# Patient Record
Sex: Female | Born: 1940 | Race: White | Hispanic: No | Marital: Married | Smoking: Never smoker
Health system: Southern US, Community
[De-identification: ages and names within clinical notes are randomized; demographics above are authoritative.]

## PROBLEM LIST (undated history)

## (undated) DIAGNOSIS — K219 Gastro-esophageal reflux disease without esophagitis: Secondary | ICD-10-CM

## (undated) DIAGNOSIS — S42309A Unspecified fracture of shaft of humerus, unspecified arm, initial encounter for closed fracture: Secondary | ICD-10-CM

## (undated) DIAGNOSIS — R42 Dizziness and giddiness: Secondary | ICD-10-CM

## (undated) HISTORY — PX: ABDOMINAL HYSTERECTOMY: SHX81

## (undated) HISTORY — DX: Unspecified fracture of shaft of humerus, unspecified arm, initial encounter for closed fracture: S42.309A

## (undated) HISTORY — DX: Gastro-esophageal reflux disease without esophagitis: K21.9

---

## 1993-02-01 HISTORY — PX: CARDIAC CATHETERIZATION: SHX172

## 1997-09-10 ENCOUNTER — Other Ambulatory Visit: Admission: RE | Admit: 1997-09-10 | Discharge: 1997-09-10 | Payer: Self-pay | Admitting: *Deleted

## 1997-11-13 ENCOUNTER — Other Ambulatory Visit: Admission: RE | Admit: 1997-11-13 | Discharge: 1997-11-13 | Payer: Self-pay | Admitting: *Deleted

## 1998-11-20 ENCOUNTER — Other Ambulatory Visit: Admission: RE | Admit: 1998-11-20 | Discharge: 1998-11-20 | Payer: Self-pay | Admitting: *Deleted

## 1999-07-27 ENCOUNTER — Inpatient Hospital Stay (HOSPITAL_COMMUNITY): Admission: RE | Admit: 1999-07-27 | Discharge: 1999-07-29 | Payer: Self-pay | Admitting: *Deleted

## 2001-07-19 ENCOUNTER — Other Ambulatory Visit: Admission: RE | Admit: 2001-07-19 | Discharge: 2001-07-19 | Payer: Self-pay | Admitting: *Deleted

## 2002-01-11 ENCOUNTER — Encounter: Admission: RE | Admit: 2002-01-11 | Discharge: 2002-01-11 | Payer: Self-pay

## 2002-10-23 ENCOUNTER — Other Ambulatory Visit: Admission: RE | Admit: 2002-10-23 | Discharge: 2002-10-23 | Payer: Self-pay | Admitting: *Deleted

## 2011-03-27 ENCOUNTER — Ambulatory Visit: Payer: Self-pay | Admitting: Internal Medicine

## 2012-02-21 ENCOUNTER — Encounter (HOSPITAL_COMMUNITY): Payer: Self-pay | Admitting: Family Medicine

## 2012-02-21 ENCOUNTER — Emergency Department (HOSPITAL_COMMUNITY)
Admission: EM | Admit: 2012-02-21 | Discharge: 2012-02-21 | Disposition: A | Discharge status: Home / Self Care | Payer: Medicare Other | Attending: Emergency Medicine | Admitting: Emergency Medicine

## 2012-02-21 ENCOUNTER — Emergency Department (HOSPITAL_COMMUNITY): Payer: Medicare Other

## 2012-02-21 DIAGNOSIS — R42 Dizziness and giddiness: Secondary | ICD-10-CM

## 2012-02-21 DIAGNOSIS — R110 Nausea: Secondary | ICD-10-CM | POA: Insufficient documentation

## 2012-02-21 DIAGNOSIS — M549 Dorsalgia, unspecified: Secondary | ICD-10-CM

## 2012-02-21 HISTORY — DX: Dizziness and giddiness: R42

## 2012-02-21 LAB — COMPREHENSIVE METABOLIC PANEL WITH GFR
ALT: 22 U/L (ref 0–35)
BUN: 20 mg/dL (ref 6–23)
CO2: 25 meq/L (ref 19–32)
Calcium: 9.7 mg/dL (ref 8.4–10.5)
Creatinine, Ser: 0.52 mg/dL (ref 0.50–1.10)
GFR calc Af Amer: >90 mL/min (ref >90)
GFR calc non Af Amer: >90 mL/min (ref >90)
Glucose, Bld: 103 mg/dL — ABNORMAL HIGH (ref 70–99)

## 2012-02-21 LAB — CBC WITH DIFFERENTIAL/PLATELET
Basophils Absolute: 0 10*3/uL (ref 0.0–0.1)
Basophils Relative: 0 % (ref 0–1)
Eosinophils Absolute: 0.3 10*3/uL (ref 0.0–0.7)
Eosinophils Relative: 3 % (ref 0–5)
HCT: 39.2 % (ref 36.0–46.0)
Hemoglobin: 13.3 g/dL (ref 12.0–15.0)
Lymphocytes Relative: 14 % (ref 12–46)
Lymphs Abs: 1.1 10*3/uL (ref 0.7–4.0)
MCH: 30.7 pg (ref 26.0–34.0)
MCHC: 33.9 g/dL (ref 30.0–36.0)
MCV: 90.5 fL (ref 78.0–100.0)
Monocytes Absolute: 0.8 10*3/uL (ref 0.1–1.0)
Monocytes Relative: 10 % (ref 3–12)
Neutro Abs: 5.9 10*3/uL (ref 1.7–7.7)
Neutrophils Relative %: 72 % (ref 43–77)
Platelets: 354 10*3/uL (ref 150–400)
RBC: 4.33 MIL/uL (ref 3.87–5.11)
RDW: 13.1 % (ref 11.5–15.5)
WBC: 8.2 10*3/uL (ref 4.0–10.5)

## 2012-02-21 LAB — LIPASE, BLOOD: Lipase: 45 U/L (ref 11–59)

## 2012-02-21 LAB — COMPREHENSIVE METABOLIC PANEL
AST: 30 U/L (ref 0–37)
Albumin: 3.9 g/dL (ref 3.5–5.2)
Alkaline Phosphatase: 71 U/L (ref 39–117)
Chloride: 102 mEq/L (ref 96–112)
Potassium: 4.8 mEq/L (ref 3.5–5.1)
Sodium: 138 mEq/L (ref 135–145)
Total Bilirubin: 0.2 mg/dL — ABNORMAL LOW (ref 0.3–1.2)
Total Protein: 7.6 g/dL (ref 6.0–8.3)

## 2012-02-21 LAB — POCT I-STAT TROPONIN I: Troponin i, poc: 0 ng/mL (ref 0.00–0.08)

## 2012-02-21 MED ORDER — MECLIZINE HCL 12.5 MG PO TABS: 12.5000 mg | ORAL_TABLET | Freq: Three times a day (TID) | ORAL | Status: AC | PRN

## 2012-02-21 NOTE — ED Notes (Signed)
Pt would not allow me to check bp in r leg states it hurt too much

## 2012-02-21 NOTE — ED Provider Notes (Signed)
History     CSN: [id number]  Arrival date & time 02/21/12  1659   First MD Initiated Contact with Patient 02/21/12 1822      Chief Complaint  Patient presents with  . Dizziness    (Consider location/radiation/quality/duration/timing/severity/associated sxs/prior treatment) HPI Comments: Patient reports that she was in her usual state of health except for she developed some gradual onset left upper back pain at around 11:30 this morning. She reports that she did her usual morning exercises and reports the pain actually got a little better during exercise. She denied any radiation, numbness or weakness, chest pain or shortness of breath. After eating at a restaurant with her spouse, shortly afterwards while in the car she became profoundly dizzy described more as a moving sensation. 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 She reports symptoms did seem to get worse with movement of the head. She had associated nausea but no vomiting. She denied significant headache, blurred vision, lightheadedness, syncope, chest pain. She denied slurred speech, facial droop, focal numbness or weakness. Her husband called her primary care physician who is able to see her at approximately 3 PM. Do to her continued symptoms and associated back pain, it was recommended that she be reevaluated in the emergency department. She has no history of coronary disease, MI, stroke. She reports as she was coming closer to the hospital here her symptoms are markedly improved. The upper left back pain is still present but not as severe as it had been. Her dizziness, or vertiginous symptoms, nausea or ulcers all. She is able to stand and walk around without any further reproduction of her symptoms.  The history is provided by the patient and the spouse.    Past Medical History  Diagnosis Date  . Vertigo     Past Surgical History    Procedure Date  . Abdominal hysterectomy     History reviewed. No pertinent family history.  History  Substance Use Topics  . Smoking status: Never Smoker   . Smokeless tobacco: Not on file  . Alcohol Use: No    OB History    Grav Para Term Preterm Abortions TAB SAB Ect Mult Living                  Review of Systems  Constitutional: Negative for fever, chills and diaphoresis.  Eyes: Negative for visual disturbance.  Respiratory: Negative for chest tightness and shortness of breath.   Cardiovascular: Negative for chest pain.  Gastrointestinal: Positive for nausea.  Musculoskeletal: Positive for back pain.  Neurological: Positive for dizziness. Negative for syncope, speech difficulty, weakness, light-headedness and numbness.  All other systems reviewed and are negative.    Allergies  Codeine  Home Medications   Current Outpatient Rx  Name  Route  Sig  Dispense  Refill  . ASPIRIN 81 MG PO TABS   Oral   Take 81 mg by mouth daily. For pain.         Marland Kitchen MECLIZINE HCL 12.5 MG PO TABS   Oral   Take 1 tablet (12.5 mg total) by mouth 3 (three) times daily as needed for dizziness (vertigo).   20 tablet   0     BP 150/75  Pulse 58  Temp 98 F (36.7 C)  Resp 16  SpO2 100%  Physical Exam  Nursing note and vitals reviewed. Constitutional: She is oriented to person, place,  and time. She appears well-developed and well-nourished.  HENT:  Head: Normocephalic and atraumatic.  Eyes: Pupils are equal, round, and reactive to light.  Neck: Normal range of motion. Neck supple.  Cardiovascular: Normal rate and regular rhythm.   No murmur heard. Pulmonary/Chest: Effort normal. No respiratory distress. She has no wheezes.  Abdominal: Soft. She exhibits no distension. There is no tenderness.  Musculoskeletal: She exhibits no tenderness.  Neurological: She is alert and oriented to person, place, and time. No cranial nerve deficit or sensory deficit. She exhibits normal  muscle tone. Coordination and gait normal. GCS eye subscore is 4. GCS verbal subscore is 5. GCS motor subscore is 6.       Normal finger to nose, no arm drift  Skin: Skin is warm.  Psychiatric: She has a normal mood and affect.    ED Course  Procedures (including critical care time)  Labs Reviewed  COMPREHENSIVE METABOLIC PANEL - Abnormal; Notable for the following:    Glucose, Bld 103 (*)     Total Bilirubin 0.2 (*)     All other components within normal limits  CBC WITH DIFFERENTIAL  LIPASE, BLOOD  POCT I-STAT TROPONIN I  URINALYSIS, ROUTINE W REFLEX MICROSCOPIC   Dg Chest 2 View  02/21/2012  *RADIOLOGY REPORT*  Clinical Data: Near-syncope.  Back pain.  CHEST - 2 VIEW  Comparison: None.  Findings: There is cardiomegaly without edema.  Lungs clear.  No pneumothorax or pleural effusion.  Thoracolumbar scoliosis noted.  IMPRESSION: Cardiomegaly without acute disease.   Original Report Authenticated By: Holley Dexter, M.D.      1. Vertigo   2. Back pain     Room air saturation is 100% I interpret this to be normal.  ECG obtained at time 17:08, shows normal sinus rhythm at a rate of 65, borderline low voltage QRS, nonspecific T wave abnormalities, normal axis. No prior EKGs are available. This is a borderline ECG by my interpretation.    7:08 PM No difference is noted between UE blood pressures.  MDM   Patient with symptoms that seem most consistent with acute vertigo and symptoms seemed mostly resolved at this time. She did have some upper left back pain that she reports improved with physical activity. This makes coronary disease less likely. Her EKG shows no active ischemia and her initial 20 care troponin is normal. Her symptoms began at 11:30, with a negative troponin, I am confident that MIs excluded. Chest x-ray shows no widened mediastinum. Also obtain a 4 lamp blood pressure reading to assess for discrepancy between her right and left upper extremities. If this is  negative, plan is to discharge the patient home with a prescription for Antivert.        Gavin Pound. Oletta Lamas, MD 02/21/12 4098

## 2012-02-21 NOTE — Discharge Instructions (Signed)
 Back Pain, Adult Low back pain is very common. About 1 in 5 people have back pain.The cause of low back pain is rarely dangerous. The pain often gets better over time.About half of people with a sudden onset of back pain feel better in just 2 weeks. About 8 in 10 people feel better by 6 weeks.  CAUSES Some common causes of back pain include:  Strain of the muscles or ligaments supporting the spine.  Wear and tear (degeneration) of the spinal discs.  Arthritis.  Direct injury to the back. DIAGNOSIS Most of the time, the direct cause of low back pain is not known.However, back pain can be treated effectively even when the exact cause of the pain is unknown.Answering your caregiver's questions about your overall health and symptoms is one of the most accurate ways to make sure the cause of your pain is not dangerous. If your caregiver needs more information, he or she may order lab work or imaging tests (X-rays or MRIs).However, even if imaging tests show changes in your back, this usually does not require surgery. HOME CARE INSTRUCTIONS For many people, back pain returns.Since low back pain is rarely dangerous, it is often a condition that people can learn to [facility] their own.   Remain active. It is stressful on the back to sit or stand in one place. Do not sit, drive, or stand in one place for more than 30 minutes at a time. Take short walks on level surfaces as soon as pain allows.Try to increase the length of time you walk each day.  Do not stay in bed.Resting more than 1 or 2 days can delay your recovery.  Do not avoid exercise or work.Your body is made to move.It is not dangerous to be active, even though your back may hurt.Your back will likely heal faster if you return to being active before your pain is gone.  Pay attention to your body when you bend and lift. Many people have less discomfortwhen lifting if they bend their knees, keep the load close to their bodies,and  avoid twisting. Often, the most comfortable positions are those that put less stress on your recovering back.  Find a comfortable position to sleep. Use a firm mattress and lie on your side with your knees slightly bent. If you lie on your back, put a pillow under your knees.  Only take over-the-counter or prescription medicines as directed by your caregiver. Over-the-counter medicines to reduce pain and inflammation are often the most helpful.Your caregiver may prescribe muscle relaxant drugs.These medicines help dull your pain so you can more quickly return to your normal activities and healthy exercise.  Put ice on the injured area.  Put ice in a plastic bag.  Place a towel between your skin and the bag.  Leave the ice on for 15 to 20 minutes, 3 to 4 times a day for the first 2 to 3 days. After that, ice and heat may be alternated to reduce pain and spasms.  Ask your caregiver about trying back exercises and gentle massage. This may be of some benefit.  Avoid feeling anxious or stressed.Stress increases muscle tension and can worsen back pain.It is important to recognize when you are anxious or stressed and learn ways to manage it.Exercise is a great option. SEEK MEDICAL CARE IF:  You have pain that is not relieved with rest or medicine.  You have pain that does not improve in 1 week.  You have new symptoms.  You are generally  not feeling well. SEEK IMMEDIATE MEDICAL CARE IF:   You have pain that radiates from your back into your legs.  You develop new bowel or bladder control problems.  You have unusual weakness or numbness in your arms or legs.  You develop nausea or vomiting.  You develop abdominal pain.  You feel faint. Document Released: 01/18/2005 Document Revised: 07/20/2011 Document Reviewed: 06/08/2010 [facility] Patient Information 2013 [place], [place].     Vertigo Vertigo means you feel like you or your surroundings are moving when they are not. Vertigo  can be dangerous if it occurs when you are at work, driving, or performing difficult activities.  CAUSES  Vertigo occurs when there is a conflict of signals sent to your brain from the visual and sensory systems in your body. There are many different causes of vertigo, including:  Infections, especially in the inner ear.  A bad reaction to a drug or misuse of alcohol and medicines.  Withdrawal from drugs or alcohol.  Rapidly changing positions, such as lying down or rolling over in bed.  A migraine headache.  Decreased blood flow to the brain.  Increased pressure in the brain from a head injury, infection, tumor, or bleeding. SYMPTOMS  You may feel as though the world is spinning around or you are falling to the ground. Because your balance is upset, vertigo can cause nausea and vomiting. You may have involuntary eye movements (nystagmus). DIAGNOSIS  Vertigo is usually diagnosed by physical exam. If the cause of your vertigo is unknown, your caregiver may perform imaging tests, such as an MRI scan (magnetic resonance imaging). TREATMENT  Most cases of vertigo resolve on their own, without treatment. Depending on the cause, your caregiver may prescribe certain medicines. If your vertigo is related to body position issues, your caregiver may recommend movements or procedures to correct the problem. In rare cases, if your vertigo is caused by certain inner ear problems, you may need surgery. HOME CARE INSTRUCTIONS   Follow your caregiver's instructions.  Avoid driving.  Avoid operating heavy machinery.  Avoid performing any tasks that would be dangerous to you or others during a vertigo episode.  Tell your caregiver if you notice that certain medicines seem to be causing your vertigo. Some of the medicines used to treat vertigo episodes can actually make them worse in some people. SEEK IMMEDIATE MEDICAL CARE IF:   Your medicines do not relieve your vertigo or are making it  worse.  You develop problems with talking, walking, weakness, or using your arms, hands, or legs.  You develop severe headaches.  Your nausea or vomiting continues or gets worse.  You develop visual changes.  A family member notices behavioral changes.  Your condition gets worse. MAKE SURE YOU:  Understand these instructions.  Will watch your condition.  Will get help right away if you are not doing well or get worse. Document Released: 10/28/2004 Document Revised: 04/12/2011 Document Reviewed: 08/06/2010 [facility] Patient Information 2013 [place], [place].

## 2012-02-21 NOTE — ED Notes (Signed)
Per pt sts extreme dizziness, nausea, that started about 1:30. sts after eating. Denies chest pain and SOB. sts pain ain upper back.

## 2013-08-21 ENCOUNTER — Other Ambulatory Visit: Payer: Self-pay | Admitting: Family Medicine

## 2013-08-21 DIAGNOSIS — Z1231 Encounter for screening mammogram for malignant neoplasm of breast: Secondary | ICD-10-CM

## 2013-08-23 ENCOUNTER — Other Ambulatory Visit: Payer: Self-pay | Admitting: Family Medicine

## 2013-08-23 ENCOUNTER — Ambulatory Visit
Admission: RE | Admit: 2013-08-23 | Discharge: 2013-08-23 | Disposition: A | Discharge status: Home / Self Care | Payer: 59 | Source: Ambulatory Visit | Attending: Family Medicine | Admitting: Family Medicine

## 2013-08-23 DIAGNOSIS — Z139 Encounter for screening, unspecified: Secondary | ICD-10-CM

## 2013-10-01 NOTE — Telephone Encounter (Signed)
This encounter was created in error - please disregard.

## 2013-10-19 ENCOUNTER — Emergency Department: Payer: Self-pay | Admitting: Emergency Medicine

## 2013-10-31 ENCOUNTER — Encounter: Payer: Self-pay | Admitting: Cardiovascular Disease

## 2013-10-31 ENCOUNTER — Encounter (INDEPENDENT_AMBULATORY_CARE_PROVIDER_SITE_OTHER): Payer: Self-pay

## 2013-10-31 ENCOUNTER — Ambulatory Visit (INDEPENDENT_AMBULATORY_CARE_PROVIDER_SITE_OTHER): Payer: Medicare Other | Admitting: Cardiovascular Disease

## 2013-10-31 VITALS — BP 158/90 | HR 56 | Ht 67.0 in | Wt 126.2 lb

## 2013-10-31 DIAGNOSIS — R42 Dizziness and giddiness: Secondary | ICD-10-CM

## 2013-10-31 DIAGNOSIS — E785 Hyperlipidemia, unspecified: Secondary | ICD-10-CM

## 2013-10-31 DIAGNOSIS — S42309S Unspecified fracture of shaft of humerus, unspecified arm, sequela: Secondary | ICD-10-CM

## 2013-10-31 DIAGNOSIS — R9431 Abnormal electrocardiogram [ECG] [EKG]: Secondary | ICD-10-CM

## 2013-10-31 DIAGNOSIS — S42302S Unspecified fracture of shaft of humerus, left arm, sequela: Secondary | ICD-10-CM

## 2013-10-31 DIAGNOSIS — I517 Cardiomegaly: Secondary | ICD-10-CM

## 2013-10-31 DIAGNOSIS — R0602 Shortness of breath: Secondary | ICD-10-CM

## 2013-10-31 DIAGNOSIS — S42302A Unspecified fracture of shaft of humerus, left arm, initial encounter for closed fracture: Secondary | ICD-10-CM | POA: Insufficient documentation

## 2013-10-31 NOTE — Assessment & Plan Note (Signed)
She has followup with orthopedics. Still a lot of pain as she is unable to tolerate pain medication. Suggested she try naproxen
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
## 2013-10-31 NOTE — Assessment & Plan Note (Addendum)
We spent most of the visit talking about her cholesterol. She watches what she eats, exercises. Likely secondary to genetics.  We have recommended when her chest is better, that we schedule a CT calcium score in [place]. This will determine if she has underlying calcified plaque and whether she needs to start a cholesterol medication. This was discussed with her she will call us when she would like to schedule

## 2013-10-31 NOTE — Patient Instructions (Signed)
You are doing well. No medication changes were made.  Try naproxen 1 to 2 pills for arm pain  Call at you convenience to  schedule an echocardiogram for cardiomegaly, looks at heart size Call to schedule a cardiac calcium score (heart CT scan) (in [place]), looks for bloackage  Please call us if you have new issues that need to be addressed before your next appt.

## 2013-11-14 ENCOUNTER — Telehealth: Payer: Self-pay | Admitting: *Deleted
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
## 2013-11-15 NOTE — Telephone Encounter (Signed)
Spoke w/ pt.  She is sched for CT Ca score in [place] office tomorrow at 1:30. Pt understands to arrive at 1:15 and that there is a fee of $150 due at the time of her procedure.

## 2013-11-16 ENCOUNTER — Ambulatory Visit (INDEPENDENT_AMBULATORY_CARE_PROVIDER_SITE_OTHER)
Admission: RE | Admit: 2013-11-16 | Discharge: 2013-11-16 | Disposition: A | Discharge status: Home / Self Care | Payer: Self-pay | Source: Ambulatory Visit | Attending: Cardiovascular Disease | Admitting: Cardiovascular Disease

## 2013-11-16 DIAGNOSIS — R0602 Shortness of breath: Secondary | ICD-10-CM

## 2013-11-16 DIAGNOSIS — R42 Dizziness and giddiness: Secondary | ICD-10-CM

## 2013-11-16 DIAGNOSIS — R9431 Abnormal electrocardiogram [ECG] [EKG]: Secondary | ICD-10-CM

## 2013-11-30 ENCOUNTER — Other Ambulatory Visit (INDEPENDENT_AMBULATORY_CARE_PROVIDER_SITE_OTHER): Payer: Medicare Other

## 2013-11-30 ENCOUNTER — Other Ambulatory Visit: Payer: Self-pay

## 2013-11-30 DIAGNOSIS — I517 Cardiomegaly: Secondary | ICD-10-CM

## 2013-11-30 DIAGNOSIS — R0602 Shortness of breath: Secondary | ICD-10-CM

## 2013-11-30 DIAGNOSIS — R42 Dizziness and giddiness: Secondary | ICD-10-CM

## 2013-11-30 DIAGNOSIS — R9431 Abnormal electrocardiogram [ECG] [EKG]: Secondary | ICD-10-CM

## 2014-02-07 ENCOUNTER — Other Ambulatory Visit: Payer: Self-pay | Admitting: Family Medicine

## 2014-02-07 ENCOUNTER — Ambulatory Visit
Admission: RE | Admit: 2014-02-07 | Discharge: 2014-02-07 | Disposition: A | Discharge status: Home / Self Care | Payer: PRIVATE HEALTH INSURANCE | Source: Ambulatory Visit | Attending: Family Medicine | Admitting: Family Medicine

## 2014-02-07 DIAGNOSIS — M549 Dorsalgia, unspecified: Secondary | ICD-10-CM

## 2014-03-08 ENCOUNTER — Encounter: Payer: Self-pay | Admitting: Cardiovascular Disease

## 2014-03-08 ENCOUNTER — Ambulatory Visit (INDEPENDENT_AMBULATORY_CARE_PROVIDER_SITE_OTHER): Payer: PRIVATE HEALTH INSURANCE | Admitting: Cardiovascular Disease

## 2014-03-08 VITALS — BP 172/80 | HR 56 | Ht 67.0 in | Wt 129.0 lb

## 2014-03-08 DIAGNOSIS — R079 Chest pain, unspecified: Secondary | ICD-10-CM

## 2014-03-08 DIAGNOSIS — I2510 Atherosclerotic heart disease of native coronary artery without angina pectoris: Secondary | ICD-10-CM

## 2014-03-08 DIAGNOSIS — M546 Pain in thoracic spine: Secondary | ICD-10-CM

## 2014-03-08 DIAGNOSIS — E785 Hyperlipidemia, unspecified: Secondary | ICD-10-CM

## 2014-03-08 DIAGNOSIS — M549 Dorsalgia, unspecified: Secondary | ICD-10-CM | POA: Insufficient documentation

## 2014-03-08 DIAGNOSIS — I517 Cardiomegaly: Secondary | ICD-10-CM

## 2014-03-08 NOTE — Assessment & Plan Note (Signed)
Minimal coronary disease by CT calcium scoring , score of 200. Suggested she could use a scan every several years if she would like to track her coronary disease progression.  She does not want a statin.  Aortic atherosclerosis seen on chest x-ray per the report
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
## 2014-03-08 NOTE — Assessment & Plan Note (Signed)
Recent echocardiogram essentially normal. Suggested she disregard previous  Chest x-ray suggesting cardiomegaly.

## 2014-03-08 NOTE — Assessment & Plan Note (Signed)
Cholesterol discussed with her. She does not want a statin

## 2014-03-08 NOTE — Patient Instructions (Signed)
You are doing well. No medication changes were made.  Please call us if you have new issues that need to be addressed before your next appt.  Your physician wants you to follow-up in: 12 months.  You will receive a reminder letter in the mail two months in advance. If you don't receive a letter, please call our office to schedule the follow-up appointment. 

## 2014-03-08 NOTE — Assessment & Plan Note (Signed)
Back pain has essentially resolved, occasionally flares up if she bends over for long periods of time suggesting that she previously flared up a disc.  She is active at the gym with no symptoms of angina. Low calcium score. No further workup needed.  Suggested if her back pain recurs that she rest her activities, NSAIDs, if pain persists consider MRI, prednisone, chiropractic

## 2014-04-22 ENCOUNTER — Telehealth: Payer: Self-pay

## 2014-06-12 ENCOUNTER — Encounter (INDEPENDENT_AMBULATORY_CARE_PROVIDER_SITE_OTHER): Payer: Medicare Other | Admitting: Ophthalmology

## 2014-06-12 DIAGNOSIS — H43813 Vitreous degeneration, bilateral: Secondary | ICD-10-CM | POA: Diagnosis not present

## 2014-06-12 DIAGNOSIS — H35372 Puckering of macula, left eye: Secondary | ICD-10-CM | POA: Diagnosis not present

## 2014-12-04 ENCOUNTER — Telehealth: Payer: Self-pay | Admitting: Cardiovascular Disease

## 2014-12-04 NOTE — Telephone Encounter (Signed)
3 attempts made to schedule from recall list.  LMOV to call office for scheduling.   ° ° ° °Deleting recall.    °

## 2015-01-13 ENCOUNTER — Encounter (INDEPENDENT_AMBULATORY_CARE_PROVIDER_SITE_OTHER): Payer: Medicare Other | Admitting: Ophthalmology

## 2015-01-13 DIAGNOSIS — H26492 Other secondary cataract, left eye: Secondary | ICD-10-CM

## 2015-01-13 DIAGNOSIS — H43813 Vitreous degeneration, bilateral: Secondary | ICD-10-CM

## 2015-01-13 DIAGNOSIS — H35372 Puckering of macula, left eye: Secondary | ICD-10-CM | POA: Diagnosis not present

## 2015-02-12 ENCOUNTER — Ambulatory Visit (INDEPENDENT_AMBULATORY_CARE_PROVIDER_SITE_OTHER): Payer: Medicare Other | Admitting: Ophthalmology

## 2015-02-19 ENCOUNTER — Ambulatory Visit (INDEPENDENT_AMBULATORY_CARE_PROVIDER_SITE_OTHER): Payer: Medicare Other | Admitting: Ophthalmology

## 2015-02-19 DIAGNOSIS — H2702 Aphakia, left eye: Secondary | ICD-10-CM

## 2015-03-22 ENCOUNTER — Emergency Department (INDEPENDENT_AMBULATORY_CARE_PROVIDER_SITE_OTHER)
Admission: EM | Admit: 2015-03-22 | Discharge: 2015-03-22 | Disposition: A | Discharge status: Home / Self Care | Payer: PRIVATE HEALTH INSURANCE | Source: Home / Self Care | Attending: Emergency Medicine | Admitting: Emergency Medicine

## 2015-03-22 ENCOUNTER — Encounter (HOSPITAL_COMMUNITY): Payer: Self-pay | Admitting: Emergency Medicine

## 2015-03-22 DIAGNOSIS — K047 Periapical abscess without sinus: Secondary | ICD-10-CM | POA: Diagnosis not present

## 2015-03-22 DIAGNOSIS — K0889 Other specified disorders of teeth and supporting structures: Secondary | ICD-10-CM | POA: Diagnosis not present

## 2015-03-22 MED ORDER — HYDROCODONE-ACETAMINOPHEN 5-325 MG PO TABS: 1.0000 | ORAL_TABLET | ORAL | Status: AC | PRN

## 2015-03-22 MED ORDER — AMOXICILLIN 500 MG PO CAPS: 1000.0000 mg | ORAL_CAPSULE | Freq: Two times a day (BID) | ORAL | Status: AC

## 2015-03-22 NOTE — ED Notes (Signed)
C/o dental pain onset Thursday on right upper side Also reports she saw the dentist Thursday A&O x4... No acute distress.

## 2015-03-22 NOTE — Discharge Instructions (Signed)
Dental Abscess Call your dentist Monday. He is responsible for your post op care. A dental abscess is a collection of pus in or around a tooth. CAUSES This condition is caused by a bacterial infection around the root of the tooth that involves the inner part of the tooth (pulp). It may result from:  Severe tooth decay.  Trauma to the tooth that allows bacteria to enter into the pulp, such as a broken or chipped tooth.  Severe gum disease around a tooth. SYMPTOMS Symptoms of this condition include:  Severe pain in and around the infected tooth.  Swelling and redness around the infected tooth, in the mouth, or in the face.  Tenderness.  Pus drainage.  Bad breath.  Bitter taste in the mouth.  Difficulty swallowing.  Difficulty opening the mouth.  Nausea.  Vomiting.  Chills.  Swollen neck glands.  Fever. DIAGNOSIS This condition is diagnosed with examination of the infected tooth. During the exam, your dentist may tap on the infected tooth. Your dentist will also ask about your medical and dental history and may order X-rays. TREATMENT This condition is treated by eliminating the infection. This may be done with:  Antibiotic medicine.  A root canal. This may be performed to save the tooth.  Pulling (extracting) the tooth. This may also involve draining the abscess. This is done if the tooth cannot be saved. HOME CARE INSTRUCTIONS  Take medicines only as directed by your dentist.  If you were prescribed antibiotic medicine, finish all of it even if you start to feel better.  Rinse your mouth (gargle) often with salt water to relieve pain or swelling.  Do not drive or operate heavy machinery while taking pain medicine.  Do not apply heat to the outside of your mouth.  Keep all follow-up visits as directed by your dentist. This is important. SEEK MEDICAL CARE IF:  Your pain is worse and is not helped by medicine. SEEK IMMEDIATE MEDICAL CARE IF:  You have a  fever or chills.  Your symptoms suddenly get worse.  You have a very bad headache.  You have problems breathing or swallowing.  You have trouble opening your mouth.  You have swelling in your neck or around your eye.   This information is not intended to replace advice given to you by your health care provider. Make sure you discuss any questions you have with your health care provider.   Document Released: 01/18/2005 Document Revised: 06/04/2014 Document Reviewed: 01/15/2014 Elsevier Interactive Patient Education 2016 [employer]  Dental Pain Dental pain may be caused by many things, including:  Tooth decay (cavities or caries). Cavities expose the nerve of your tooth to air and hot or cold temperatures. This can cause pain or discomfort.  Abscess or infection. A dental abscess is a collection of infected pus from a bacterial infection in the inner part of the tooth (pulp). It usually occurs at the end of the tooth's root.  Injury.  An unknown reason (idiopathic). Your pain may be mild or severe. It may only occur when:  You are chewing.  You are exposed to hot or cold temperature.  You are eating or drinking sugary foods or beverages, such as soda or candy. Your pain may also be constant. HOME CARE INSTRUCTIONS Watch your dental pain for any changes. The following actions may help to lessen any discomfort that you are feeling:  Take medicines only as directed by your dentist.  If you were prescribed an antibiotic medicine, finish all  of it even if you start to feel better.  Keep all follow-up visits as directed by your dentist. This is important.  Do not apply heat to the outside of your face.  Rinse your mouth or gargle with salt water if directed by your dentist. This helps with pain and swelling.  You can make salt water by adding  tsp of salt to 1 cup of warm water.  Apply ice to the painful area of your face:  Put ice in a plastic bag.  Place a towel  between your skin and the bag.  Leave the ice on for 20 minutes, 2-3 times per day.  Avoid foods or drinks that cause you pain, such as:  Very hot or very cold foods or drinks.  Sweet or sugary foods or drinks. SEEK MEDICAL CARE IF:  Your pain is not controlled with medicines.  Your symptoms are worse.  You have new symptoms. SEEK IMMEDIATE MEDICAL CARE IF:  You are unable to open your mouth.  You are having trouble breathing or swallowing.  You have a fever.  Your face, neck, or jaw is swollen.   This information is not intended to replace advice given to you by your health care provider. Make sure you discuss any questions you have with your health care provider.   Document Released: 01/18/2005 Document Revised: 06/04/2014 Document Reviewed: 01/14/2014 Elsevier Interactive Patient Education [employer]

## 2015-03-22 NOTE — ED Provider Notes (Signed)
CSN: [id number]     Arrival date & time 03/22/15  1955 History   First MD Initiated Contact with Patient 03/22/15 2012     Chief Complaint  Patient presents with  . Dental Pain   (Consider location/radiation/quality/duration/timing/severity/associated sxs/prior Treatment) HPI Comments: 75 year old female had dental work performed 2 days ago by her dentist. There was some bridge work and apparent cavity feeling. She had complained during the procedure of pain but refused to have anesthesia. The day after and the following day she had persistent pain and swelling to the right face. She has attempted to call her dentist and I have either been out of the office or have not returned phone calls. As a last resort she came to the urgent care.   Past Medical History  Diagnosis Date  . Vertigo   . Broken arm     left arm   . GERD (gastroesophageal reflux disease)    Past Surgical History  Procedure Laterality Date  . Abdominal hysterectomy    . Cardiac catheterization  1995   Family History  Problem Relation Age of Onset  . Heart attack Mother   . Hypertension Brother   . Hypertension Sister    Social History  Substance Use Topics  . Smoking status: Never Smoker   . Smokeless tobacco: None  . Alcohol Use: No   OB History    No data available     Review of Systems  Constitutional: Negative.   HENT: Positive for congestion, dental problem and facial swelling.   Respiratory: Negative.   All other systems reviewed and are negative.   Allergies  Codeine  Home Medications   Prior to Admission medications   Medication Sig Start Date End Date Taking? Authorizing Provider  amoxicillin (AMOXIL) 500 MG capsule Take 2 capsules (1,000 mg total) by mouth 2 (two) times daily. 03/22/15   Hayden Rasmussen, NP  aspirin 81 MG tablet Take 81 mg by mouth daily.    Historical Provider, MD  BIOTIN PO Take 1,500 mg by mouth daily.    Historical Provider, MD  Coenzyme Q10 (CO Q-10) 200 MG CAPS Take by  mouth.    Historical Provider, MD  HYDROcodone-acetaminophen (NORCO/VICODIN) 5-325 MG tablet Take 1 tablet by mouth every 4 (four) hours as needed. 03/22/15   Hayden Rasmussen, NP  Boris Lown Oil 1000 MG CAPS Take by mouth daily.    Historical Provider, MD  meclizine (ANTIVERT) 12.5 MG tablet Take 1 tablet (12.5 mg total) by mouth 3 (three) times daily as needed for dizziness (vertigo). 02/21/12   Quita Skye, MD  Multiple Vitamin (MULTIVITAMIN) tablet Take 1 tablet by mouth daily.    Historical Provider, MD  Probiotic Product (PROBIOTIC DAILY PO) Take by mouth daily.    Historical Provider, MD  VITAMIN D, CHOLECALCIFEROL, PO Take 250 mg by mouth daily.    Historical Provider, MD  vitamin E 400 UNIT capsule Take 400 Units by mouth daily.    Historical Provider, MD   Meds Ordered and Administered this Visit  Medications - No data to display  BP 175/93 mmHg  Pulse 67  Temp(Src) 98 F (36.7 C) (Oral)  SpO2 98% No data found.   Physical Exam  Constitutional: She appears well-developed and well-nourished. No distress.  HENT:  Mouth/Throat: Oropharynx is clear and moist. No oropharyngeal exudate.  Oropharynx with minor dental tenderness to the left upper bicuspid, cuspid. No bleeding. Positive for swelling opposite of the tooth and in the right face. No abscess is actually  visualized.  Eyes: EOM are normal.  Neck: Normal range of motion. Neck supple.  Cardiovascular: Normal rate.   Lymphadenopathy:    She has no cervical adenopathy.  Neurological: She is alert.  Skin: Skin is warm and dry.  Psychiatric: She has a normal mood and affect.  Nursing note and vitals reviewed.   ED Course  Procedures (including critical care time)  Labs Review Labs Reviewed - No data to display  Imaging Review No results found.   Visual Acuity Review  Right Eye Distance:   Left Eye Distance:   Bilateral Distance:    Right Eye Near:   Left Eye Near:    Bilateral Near:         MDM   1. Pain,  dental   2. Dental infection    Call your dentist Monday. Meds ordered this encounter  Medications  . amoxicillin (AMOXIL) 500 MG capsule    Sig: Take 2 capsules (1,000 mg total) by mouth 2 (two) times daily.    Dispense:  40 capsule    Refill:  0    Order Specific Question:  Supervising Provider    Answer:  Charm Rings [id number]  . HYDROcodone-acetaminophen (NORCO/VICODIN) 5-325 MG tablet    Sig: Take 1 tablet by mouth every 4 (four) hours as needed.    Dispense:  15 tablet    Refill:  0    Order Specific Question:  Supervising Provider    Answer:  Micheline Chapman        Hayden Rasmussen, NP 03/22/15 2025

## 2015-06-23 ENCOUNTER — Ambulatory Visit (INDEPENDENT_AMBULATORY_CARE_PROVIDER_SITE_OTHER): Payer: Medicare Other | Admitting: Ophthalmology

## 2016-10-17 IMAGING — CR DG CHEST 2V
2 series · 2 of 2 positions shown · non-contrast
Comparison: 08/23/2013

CLINICAL DATA: Left posterior chest pain

EXAM:
CHEST  2 VIEW

[view not recorded (1 of 2)]
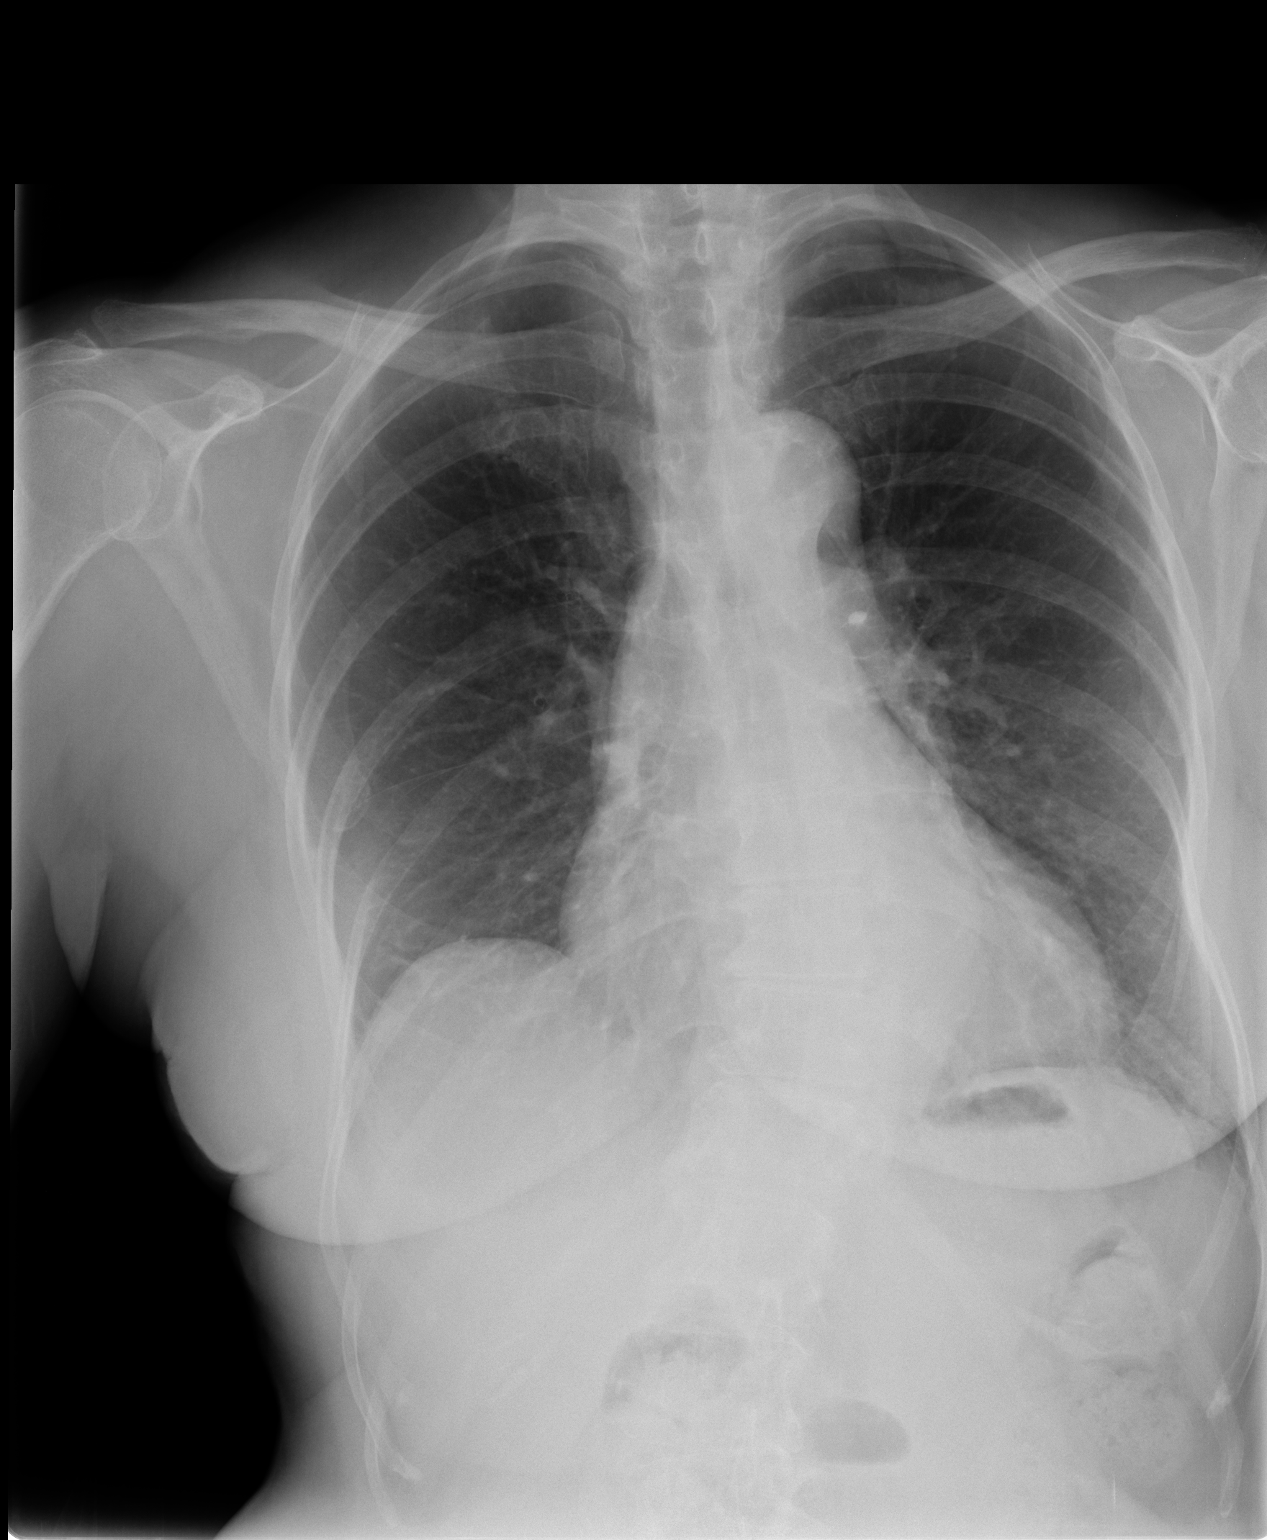

[view not recorded (2 of 2)]
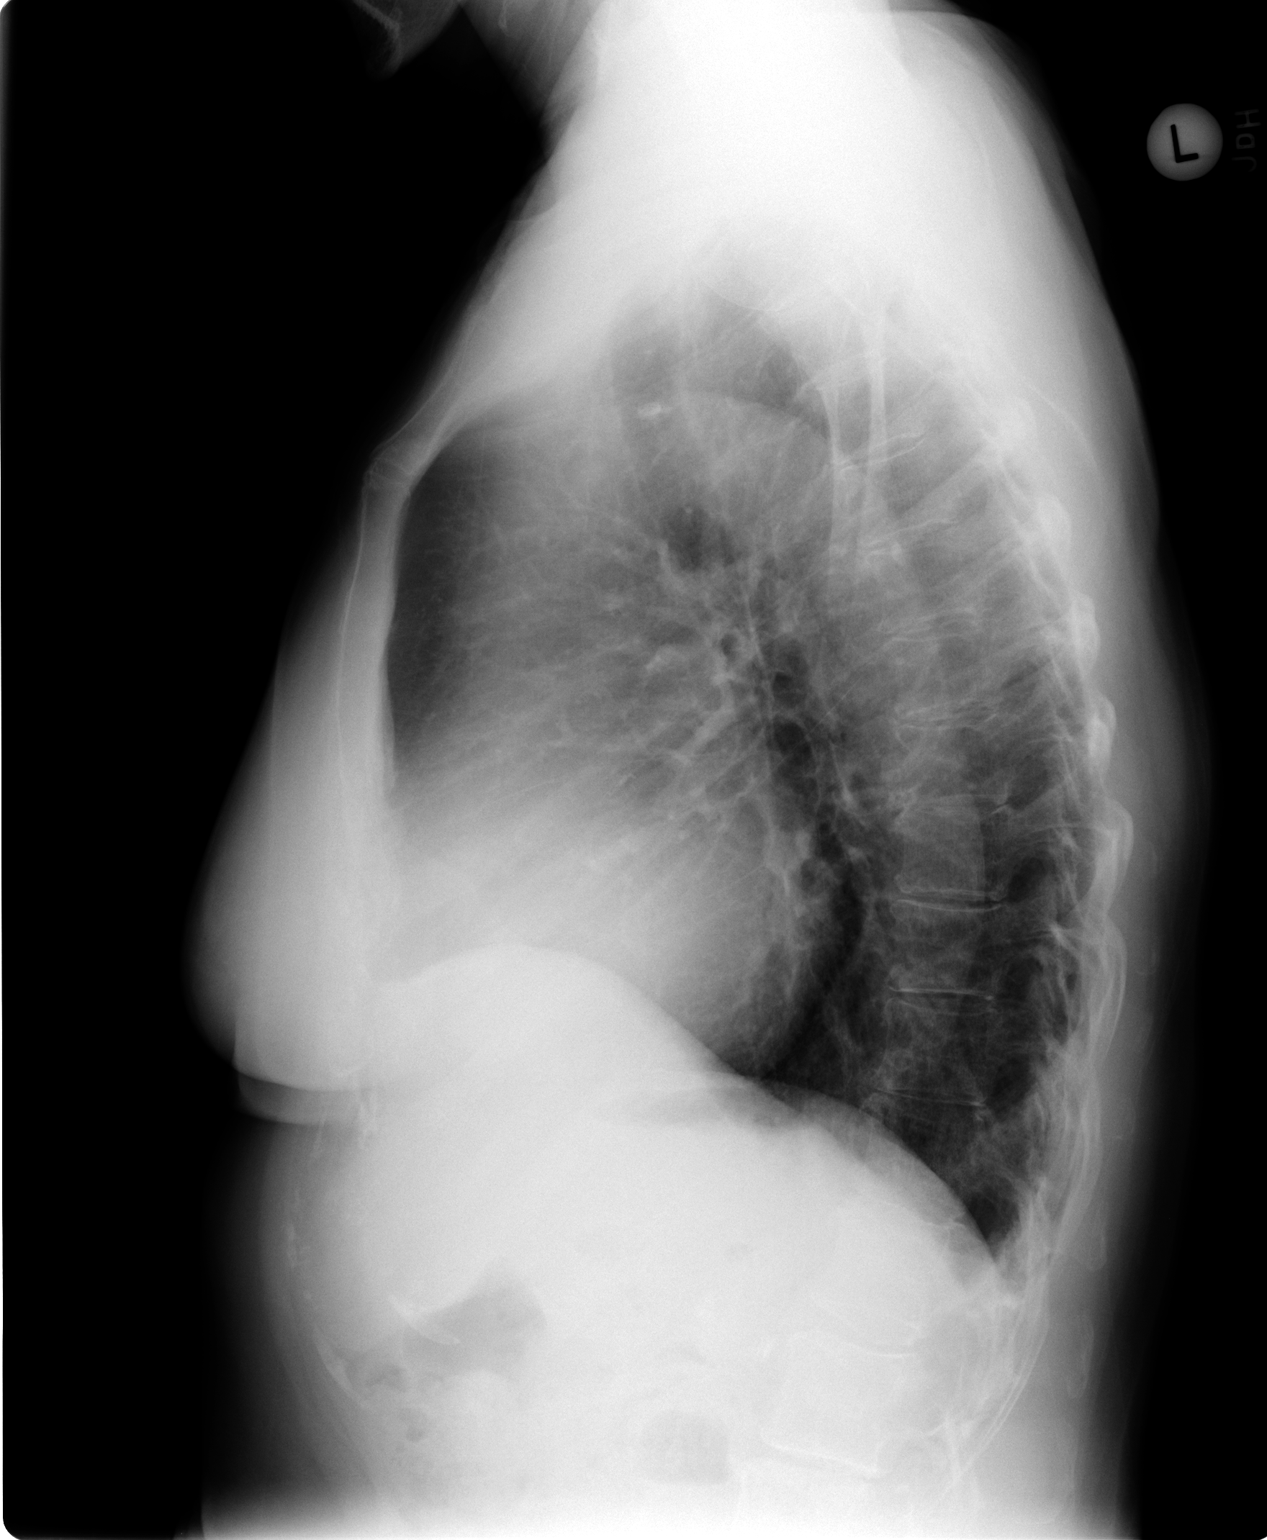

[2 of 2 positions shown; findings below may reference images not displayed]

FINDINGS: The cardiac shadow is mildly enlarged but stable. The lungs are
clear bilaterally. Mild elevation of the right hemidiaphragm is
again seen. The bony structures are within normal limits. Aortic
calcifications are again noted.
IMPRESSION: No active cardiopulmonary disease.
